# Patient Record
Sex: Female | Born: 2000 | Race: White | Hispanic: No | Marital: Single | Smoking: Never smoker
Health system: Southern US, Community
[De-identification: ages and names within clinical notes are randomized; demographics above are authoritative.]

## PROBLEM LIST (undated history)

## (undated) DIAGNOSIS — IMO0002 Reserved for concepts with insufficient information to code with codable children: Secondary | ICD-10-CM

## (undated) DIAGNOSIS — J45909 Unspecified asthma, uncomplicated: Secondary | ICD-10-CM

## (undated) DIAGNOSIS — Z8614 Personal history of Methicillin resistant Staphylococcus aureus infection: Secondary | ICD-10-CM

## (undated) HISTORY — PX: OTHER SURGICAL HISTORY: SHX169

---
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
## 2018-11-26 ENCOUNTER — Other Ambulatory Visit: Payer: Self-pay

## 2018-11-26 DIAGNOSIS — Z20822 Contact with and (suspected) exposure to covid-19: Secondary | ICD-10-CM

## 2018-11-28 LAB — NOVEL CORONAVIRUS, NAA: SARS-CoV-2, NAA: NOT DETECTED

## 2018-12-08 ENCOUNTER — Other Ambulatory Visit: Payer: Self-pay

## 2018-12-08 DIAGNOSIS — Z20822 Contact with and (suspected) exposure to covid-19: Secondary | ICD-10-CM

## 2018-12-12 LAB — NOVEL CORONAVIRUS, NAA: SARS-CoV-2, NAA: NOT DETECTED

## 2019-11-24 DIAGNOSIS — Z3042 Encounter for surveillance of injectable contraceptive: Secondary | ICD-10-CM | POA: Diagnosis not present

## 2020-02-20 DIAGNOSIS — Z3042 Encounter for surveillance of injectable contraceptive: Secondary | ICD-10-CM | POA: Diagnosis not present

## 2020-05-15 DIAGNOSIS — Z3042 Encounter for surveillance of injectable contraceptive: Secondary | ICD-10-CM | POA: Diagnosis not present

## 2020-08-22 DIAGNOSIS — Z3049 Encounter for surveillance of other contraceptives: Secondary | ICD-10-CM | POA: Diagnosis not present

## 2020-08-22 DIAGNOSIS — Z3042 Encounter for surveillance of injectable contraceptive: Secondary | ICD-10-CM | POA: Diagnosis not present

## 2020-11-08 DIAGNOSIS — Z3042 Encounter for surveillance of injectable contraceptive: Secondary | ICD-10-CM | POA: Diagnosis not present

## 2021-01-01 DIAGNOSIS — M25362 Other instability, left knee: Secondary | ICD-10-CM | POA: Diagnosis not present

## 2021-01-24 DIAGNOSIS — Z3042 Encounter for surveillance of injectable contraceptive: Secondary | ICD-10-CM | POA: Diagnosis not present

## 2021-04-15 ENCOUNTER — Encounter: Payer: Self-pay | Admitting: Emergency Medicine

## 2021-04-15 ENCOUNTER — Other Ambulatory Visit: Payer: Self-pay

## 2021-04-15 ENCOUNTER — Ambulatory Visit (INDEPENDENT_AMBULATORY_CARE_PROVIDER_SITE_OTHER): Payer: BC Managed Care – PPO

## 2021-04-15 ENCOUNTER — Ambulatory Visit
Admission: EM | Admit: 2021-04-15 | Discharge: 2021-04-15 | Disposition: A | Discharge status: Home / Self Care | Payer: BC Managed Care – PPO

## 2021-04-15 DIAGNOSIS — S93402A Sprain of unspecified ligament of left ankle, initial encounter: Secondary | ICD-10-CM | POA: Diagnosis not present

## 2021-04-15 DIAGNOSIS — M25572 Pain in left ankle and joints of left foot: Secondary | ICD-10-CM | POA: Diagnosis not present

## 2021-04-15 DIAGNOSIS — M7989 Other specified soft tissue disorders: Secondary | ICD-10-CM | POA: Diagnosis not present

## 2021-04-15 DIAGNOSIS — W19XXXA Unspecified fall, initial encounter: Secondary | ICD-10-CM | POA: Diagnosis not present

## 2021-04-15 NOTE — ED Provider Notes (Signed)
?[facility] ? ? ? ?CSN: [id number] ?Arrival date & time: 04/15/21  1707 ? ? ?  ? ?History   ?Chief Complaint ?Chief Complaint  ?Patient presents with  ? Ankle Pain  ? ? ?HPI ?DARCEL STOKLEY is a 21 y.o. female.  ? ?21 year old female, Addisin Hegeman, presents to emergency room states she was in [place] this weekend and missed a step coming off the rental property Sunday where she was staying at and rolled ankle. ? ?The history is provided by the patient. No language interpreter was used.  ?Ankle Pain ?Location:  Ankle ?Time since incident:  2 days ?Injury: yes   ?Mechanism of injury comment:  Misstep and twisted ?Ankle location:  L ankle ?Pain details:  ?  Quality:  Throbbing ?  Radiates to:  Does not radiate ?  Severity:  Moderate ?  Onset quality:  Sudden ?  Duration:  2 days ?  Timing:  Constant ?  Progression:  Unchanged ?Chronicity:  New ?Dislocation: no   ?Relieved by:  Nothing ?Exacerbated by: Works as a Art therapist, dependent. ?Ineffective treatments:  None tried ?Associated symptoms: numbness   ?Associated symptoms comment:  Swelling ? ?Past Medical History:  ?Diagnosis Date  ? Asthma   ? prn inhaler  ? History of MRSA infection 2010  ? leg  ? Malunion of fracture 05/2013  ? right middle finger  ? ? ?There are no problems to display for this patient. ? ? ?Past Surgical History:  ?Procedure Laterality Date  ? KNEE ARTHROSCOPY Left 06/2011  ? OPEN REDUCTION INTERNAL FIXATION (ORIF) METACARPAL Right 05/12/2013  ? Procedure: OPEN REDUCTION INTERNAL FIXATION (ORIF) RIGHT MIDDLE FINGER NASCENT MALUNION;  Surgeon: Roseanne Kaufman, MD;  Location: [facility];  Service: Orthopedics;  Laterality: Right;  ? ? ?OB History   ?No obstetric history on file. ?  ? ? ? ?Home Medications   ? ?Prior to Admission medications   ?Medication Sig Start Date End Date Taking? Authorizing Provider  ?acetaminophen-codeine (TYLENOL #3) 300-30 MG per tablet Take by mouth every 4 (four) hours as needed for  moderate pain.    [provider]  ?albuterol (PROVENTIL HFA;VENTOLIN HFA) 108 (90 BASE) MCG/ACT inhaler Inhale into the lungs every 6 (six) hours as needed for wheezing or shortness of breath.    [provider]  ?cetirizine (ZYRTEC) 10 MG tablet Take 10 mg by mouth daily.    [provider]  ?EPINEPHrine 0.3 mg/0.3 mL IJ SOAJ injection Inject into the muscle once.    [provider]  ?fluticasone (FLONASE) 50 MCG/ACT nasal spray Place into both nostrils 2 (two) times daily.    [provider]  ?HYDROcodone-acetaminophen (NORCO) 5-325 MG per tablet Take 2 tablets by mouth every 6 (six) hours as needed for moderate pain. 05/12/13   Roseanne Kaufman, MD  ?ibuprofen (ADVIL,MOTRIN) 600 MG tablet Take 600 mg by mouth every 6 (six) hours as needed.    [provider]  ?medroxyPROGESTERone (DEPO-PROVERA) 150 MG/ML injection Inject 150 mg into the muscle every 3 (three) months. 01/23/21   [provider]  ?Olopatadine HCl 0.2 % SOLN Apply to eye daily.    [provider]  ? ? ?Family History ?Family History  ?Problem Relation Age of Onset  ? Diabetes Maternal Grandmother   ? ? ?Social History ?Social History  ? ?Tobacco Use  ? Smoking status: Never  ? Smokeless tobacco: Never  ?Vaping Use  ? Vaping Use: Never used  ?Substance Use Topics  ?  Alcohol use: No  ? Drug use: No  ? ? ? ?Allergies   ?Penicillins ? ? ?Review of Systems ?Review of Systems  ?Musculoskeletal:  Positive for arthralgias and joint swelling.  ?All other systems reviewed and are negative. ? ? ?Physical Exam ?Triage Vital Signs ?ED Triage Vitals [04/15/21 1730]  ?Enc Vitals Group  ?   BP (!) 162/92  ?   Pulse Rate 66  ?   Resp 16  ?   Temp 97.7 ?F (36.5 ?C)  ?   Temp Source Oral  ?   SpO2 100 %  ?   Weight 182 lb 15.7 oz (83 kg)  ?   Height 5\' 6"  (1.676 m)  ?   Head Circumference   ?   Peak Flow   ?   Pain Score 6  ?   Pain Loc   ?   Pain Edu?   ?   Excl. in [place]?   ? ?No data  found. ? ?Updated Vital Signs ?BP (!) 162/92 (BP Location: Right Arm)   Pulse 66   Temp 97.7 ?F (36.5 ?C) (Oral)   Resp 16   Ht 5\' 6"  (1.676 m)   Wt 182 lb 15.7 oz (83 kg)   SpO2 100%   BMI 29.53 kg/m?  ? ?Visual Acuity ?Right Eye Distance:   ?Left Eye Distance:   ?Bilateral Distance:   ? ?Right Eye Near:   ?Left Eye Near:    ?Bilateral Near:    ? ?Physical Exam ?Vitals and nursing note reviewed.  ?Constitutional:   ?   General: She is not in acute distress. ?   Appearance: She is well-developed and well-groomed.  ?HENT:  ?   Head: Normocephalic and atraumatic.  ?Eyes:  ?   Conjunctiva/sclera: Conjunctivae normal.  ?Cardiovascular:  ?   Rate and Rhythm: Normal rate and regular rhythm.  ?   Heart sounds: No murmur heard. ?Pulmonary:  ?   Effort: Pulmonary effort is normal. No respiratory distress.  ?   Breath sounds: Normal breath sounds.  ?Abdominal:  ?   Palpations: Abdomen is soft.  ?   Tenderness: There is no abdominal tenderness.  ?Musculoskeletal:     ?   General: No swelling.  ?   Cervical back: Neck supple.  ?   Left ankle: Swelling present. Tenderness present over the lateral malleolus. Decreased range of motion. Normal pulse.  ?     Legs: ? ?Skin: ?   General: Skin is warm and dry.  ?   Capillary Refill: Capillary refill takes less than 2 seconds.  ?Neurological:  ?   General: No focal deficit present.  ?   Mental Status: She is alert and oriented to person, place, and time.  ?   GCS: GCS eye subscore is 4. GCS verbal subscore is 5. GCS motor subscore is 6.  ?   Sensory: Sensation is intact.  ?   Motor: Motor function is intact.  ?   Coordination: Coordination is intact.  ?   Gait: Gait is intact.  ?Psychiatric:     ?   Mood and Affect: Mood normal.     ?   Behavior: Behavior is cooperative.  ? ? ? ?UC Treatments / Results  ?Labs ?(all labs ordered are listed, but only abnormal results are displayed) ?Labs Reviewed - No data to display ? ?EKG ? ? ?Radiology ?DG Ankle Complete Left ? ?Result Date:  04/15/2021 ?CLINICAL DATA:  Fall with pain and swelling EXAM: LEFT ANKLE  COMPLETE - 3+ VIEW COMPARISON:  None. FINDINGS: No fracture or malalignment. Mild soft tissue swelling. Ankle mortise is symmetric IMPRESSION: Soft tissue swelling without acute osseous abnormality Electronically Signed   By: Donavan Foil M.D.   On: 04/15/2021 17:53   ? ?Procedures ?Procedures (including critical care time) ? ?Medications Ordered in UC ?Medications - No data to display ? ?Initial Impression / Assessment and Plan / UC Course  ?I have reviewed the triage vital signs and the nursing notes. ? ?Pertinent labs & imaging results that were available during my care of the patient were reviewed by me and considered in my medical decision making (see chart for details). ? ?Clinical Course as of 04/15/21 2016  ?Tue Apr 15, 2021  ?1758 DG Ankle Complete Left [JD]  ?1758 Ankle xray is negative [JD]  ?1822 Ace wrap ordered [JD]  ?  ?Clinical Course User Index ?[JD] Idamay Hosein, Jeanett Schlein, NP  ? ? ?Ddx: Left ankle sprain, fracture, dislocation ?Ace wrap applied by staff neurovascular intact pre and post application.  Verbalized understanding of plan of care to this provider. ?Final Clinical Impressions(s) / UC Diagnoses  ? ?Final diagnoses:  ?Sprain of left ankle, unspecified ligament, initial encounter  ? ? ? ?Discharge Instructions   ? ?  ?Rest, ice, elevate ,Ace wrap.  Your x-ray was negative for fracture.  May take over-the-counter Tylenol ibuprofen for discomfort/swelling.  ? ? ? ? ?ED Prescriptions   ?None ?  ? ?PDMP not reviewed this encounter. ?  ?Tori Milks, NP ?[id number] 2016 ? ?

## 2021-04-15 NOTE — ED Triage Notes (Signed)
Pt c/o left ankle pain. Started about 3 days ago. She states she was stepping off a step and landed on her ankle. She states she has swelling and has felt it burn and go numb some today.  ?

## 2021-04-15 NOTE — Discharge Instructions (Addendum)
Rest, ice, elevate ,Ace wrap.  Your x-ray was negative for fracture.  May take over-the-counter Tylenol ibuprofen for discomfort/swelling.  ?

## 2021-05-27 ENCOUNTER — Encounter: Payer: Self-pay | Admitting: Radiology

## 2021-06-06 ENCOUNTER — Encounter: Payer: Self-pay | Admitting: Radiology

## 2021-06-06 ENCOUNTER — Ambulatory Visit: Payer: BC Managed Care – PPO | Admitting: Radiology

## 2021-06-06 VITALS — BP 118/80 | Ht 66.0 in | Wt 201.0 lb

## 2021-06-06 DIAGNOSIS — Z30017 Encounter for initial prescription of implantable subdermal contraceptive: Secondary | ICD-10-CM | POA: Diagnosis not present

## 2021-06-06 NOTE — Progress Notes (Signed)
   Megan Conway 10-23-2000 [id number]   History: 21 y.o. G0 presents for contraceptive counseling,  (last Depo Provera injection 01/23/21) AEX with pediatrician 01/23/21 (has not had pap---desires to reschedule AEX)  Gynecologic History No LMP recorded. Patient has had an injection. Contraception: condoms   Obstetric History OB History  Gravida Para Term Preterm AB Living  0 0 0 0 0 0  SAB IAB Ectopic Multiple Live Births  0 0 0 0 0     The following portions of the patient's history were reviewed and updated as appropriate: allergies, current medications, past family history, past medical history, past social history, past surgical history, and problem list.  Review of Systems Pertinent items noted in HPI and remainder of comprehensive ROS otherwise negative.     Past medical history, past surgical history, family history and social history were all reviewed and documented in the EPIC chart.  ROS:  A ROS was performed and pertinent positives and negatives are included.  Exam:  Vitals:   06/06/21 1501  BP: 118/80  Weight: 201 lb (91.2 kg)  Height: 5\' 6"  (1.676 m)   Body mass index is 32.44 kg/m.   Assessment/Plan: 1. Encounter for initial prescription of etonogestrel contraceptive single-rod subdermal contraceptive implant  - Remove and insert drug implant; Future   Education given regarding options for contraception, including barrier methods, injectable contraception, IUD placement, oral contraceptives, Nexplanon .  RTO: 2 weeks for Nexplanon insertion     , WHNP-BC

## 2021-06-09 ENCOUNTER — Other Ambulatory Visit: Payer: Self-pay | Admitting: *Deleted

## 2021-06-09 DIAGNOSIS — Z30017 Encounter for initial prescription of implantable subdermal contraceptive: Secondary | ICD-10-CM

## 2021-06-20 ENCOUNTER — Ambulatory Visit (INDEPENDENT_AMBULATORY_CARE_PROVIDER_SITE_OTHER): Payer: BC Managed Care – PPO | Admitting: Radiology

## 2021-06-20 ENCOUNTER — Ambulatory Visit: Payer: BC Managed Care – PPO | Admitting: Radiology

## 2021-06-20 VITALS — BP 162/88

## 2021-06-20 DIAGNOSIS — Z30017 Encounter for initial prescription of implantable subdermal contraceptive: Secondary | ICD-10-CM | POA: Diagnosis not present

## 2021-06-20 DIAGNOSIS — Z01812 Encounter for preprocedural laboratory examination: Secondary | ICD-10-CM | POA: Diagnosis not present

## 2021-06-20 LAB — PREGNANCY, URINE: Preg Test, Ur: NEGATIVE

## 2021-06-20 NOTE — Progress Notes (Signed)
     21 y.o. G0P0000 female presents for Nexplanon insertion.  She has been counseled about alternative types of contraception and has decided this long acting method is the best for her.  Procedure, risks and benefits have all been explained.  She has no questions.  Pregnancy test: neg  After all questions were answered, consent was obtained.    Past Medical History:  Diagnosis Date   Asthma    prn inhaler   History of MRSA infection 2010   leg   Malunion of fracture 05/2013   right middle finger    Past Surgical History:  Procedure Laterality Date   finger procedure Right    2017   KNEE ARTHROSCOPY Left 06/06/2011   OPEN REDUCTION INTERNAL FIXATION (ORIF) METACARPAL Right 05/12/2013   Procedure: OPEN REDUCTION INTERNAL FIXATION (ORIF) RIGHT MIDDLE FINGER NASCENT MALUNION;  Surgeon: Dominica Severin, MD;  Location: [facility];  Service: Orthopedics;  Laterality: Right;    No current outpatient medications on file prior to visit.   No current facility-administered medications on file prior to visit.   Allergies  Allergen Reactions   Penicillins     Vitals:   06/20/21 1444  BP: (!) 162/88   Physical Exam Constitutional:      Appearance: Normal appearance.  Abdominal:     General: Abdomen is flat.  Neurological:     General: No focal deficit present.     Mental Status: She is alert and oriented to person, place, and time. Mental status is at baseline.  Psychiatric:        Mood and Affect: Mood normal.        Thought Content: Thought content normal.     Procedure: Patient placed supine on exam table with her left arm flexed at the elbow. The location for insertion site was identified 8-10 cm from epicondyle and 3-5 cm posterior to the sulcus.  In sterile fashion the area was cleansed with Betadine x 3. Insertion site and path of insertion was anesthetized with 1% Lidocaine without epinephrine, 2 cc total.  Using Nexplanon device (and after confirming  presence of rod in device), skin was pierced and then elevated along insertion path, passing device just under the skin.  Rod released and device inserted.  Rod palpated easily by myself and the patient.  Band aid and pressure bandage were applied over the site.   Chaperone, Raynelle Fanning, CMA was present for the entirety of the procedure  Assessment: Nexplanon insertion.  Plan:  Post procedure instructions reviewed with pt.  Questions answered.  Pt knows to call with any concerns or questions.  Pt is aware removal is due by 3 calendar years from today.

## 2021-07-18 ENCOUNTER — Ambulatory Visit: Payer: BC Managed Care – PPO | Admitting: Radiology

## 2021-08-22 ENCOUNTER — Ambulatory Visit: Payer: BC Managed Care – PPO | Admitting: Radiology

## 2021-09-12 ENCOUNTER — Ambulatory Visit: Payer: BC Managed Care – PPO | Admitting: Radiology

## 2021-12-02 DIAGNOSIS — L710 Perioral dermatitis: Secondary | ICD-10-CM | POA: Diagnosis not present

## 2021-12-02 DIAGNOSIS — D229 Melanocytic nevi, unspecified: Secondary | ICD-10-CM | POA: Diagnosis not present

## 2021-12-02 DIAGNOSIS — L918 Other hypertrophic disorders of the skin: Secondary | ICD-10-CM | POA: Diagnosis not present

## 2022-02-27 ENCOUNTER — Ambulatory Visit: Payer: BC Managed Care – PPO | Admitting: Radiology

## 2022-03-13 DIAGNOSIS — Z3202 Encounter for pregnancy test, result negative: Secondary | ICD-10-CM | POA: Diagnosis not present

## 2022-03-13 DIAGNOSIS — Z113 Encounter for screening for infections with a predominantly sexual mode of transmission: Secondary | ICD-10-CM | POA: Diagnosis not present

## 2022-03-13 DIAGNOSIS — Z3046 Encounter for surveillance of implantable subdermal contraceptive: Secondary | ICD-10-CM | POA: Diagnosis not present

## 2022-03-17 ENCOUNTER — Ambulatory Visit: Payer: BC Managed Care – PPO | Admitting: Radiology

## 2023-04-09 ENCOUNTER — Ambulatory Visit: Payer: PRIVATE HEALTH INSURANCE | Admitting: Family Medicine

## 2023-04-09 ENCOUNTER — Encounter: Payer: Self-pay | Admitting: Family Medicine

## 2023-04-09 VITALS — BP 123/83 | HR 101 | Temp 98.1°F | Resp 20 | Ht 66.0 in | Wt 193.0 lb

## 2023-04-09 DIAGNOSIS — Z3490 Encounter for supervision of normal pregnancy, unspecified, unspecified trimester: Secondary | ICD-10-CM | POA: Diagnosis not present

## 2023-04-09 DIAGNOSIS — Z124 Encounter for screening for malignant neoplasm of cervix: Secondary | ICD-10-CM | POA: Diagnosis not present

## 2023-04-09 DIAGNOSIS — Z23 Encounter for immunization: Secondary | ICD-10-CM

## 2023-04-09 DIAGNOSIS — Z3041 Encounter for surveillance of contraceptive pills: Secondary | ICD-10-CM

## 2023-04-09 DIAGNOSIS — Z1159 Encounter for screening for other viral diseases: Secondary | ICD-10-CM | POA: Diagnosis not present

## 2023-04-09 LAB — POCT URINE PREGNANCY: Preg Test, Ur: POSITIVE — AB

## 2023-04-09 MED ORDER — ALTAVERA 0.15-30 MG-MCG PO TABS: 1.0000 | ORAL_TABLET | Freq: Every day | ORAL | 5 refills | Status: AC

## 2023-04-09 NOTE — Assessment & Plan Note (Addendum)
 Does not know LMP.  Had spotting in February.  Was on OC until 03/21/23.  Has not had a cycle since coming off contraception.   Do not drink ETOH.  Get a PNV from the pharmacy and take one daily.  Stop caffeine.   Stat referral to OB.

## 2023-04-09 NOTE — Assessment & Plan Note (Signed)
 Request screening for hepatitis C.

## 2023-04-09 NOTE — Progress Notes (Signed)
 New Patient Office Visit  Subjective    Patient ID: Megan Conway, female    DOB: [birth year]  Age: 23 y.o. MRN: [id number]  CC:  Chief Complaint  Patient presents with   Medical Management of Chronic Issues   Immunizations   Contraception    HPI Megan Conway presents to establish care. Delightful 23 yo on OCs, asthma as a child.  Works as a Sales executive.   She si doing well and has not complaints.  She ran out of her Ocs 03/21/23.  She has not had a cycle since running out of contraception.   She is a red-head and has freckles.  She denies having any mole that she is concerned about.  No family history of melanoma.  She has seen a Dermatologist in the past.   She has gotten her HPV vaccines.  She is UTD with tetanus.  She would like to be screened for Hepatitis C.      Outpatient Encounter Medications as of 04/09/2023  Medication Sig   [DISCONTINUED] ALTAVERA 0.15-30 MG-MCG tablet Take by mouth daily.   ALTAVERA 0.15-30 MG-MCG tablet Take 1 tablet by mouth daily.   No facility-administered encounter medications on file as of 04/09/2023.    Past Medical History:  Diagnosis Date   Asthma    prn inhaler   History of MRSA infection 2010   leg   Malunion of fracture 05/2013   right middle finger    Past Surgical History:  Procedure Laterality Date   finger procedure Right    2017   KNEE ARTHROSCOPY Left 06/06/2011   OPEN REDUCTION INTERNAL FIXATION (ORIF) METACARPAL Right 05/12/2013   Procedure: OPEN REDUCTION INTERNAL FIXATION (ORIF) RIGHT MIDDLE FINGER NASCENT MALUNION;  Surgeon: Dominica Severin, MD;  Location: [facility];  Service: Orthopedics;  Laterality: Right;    Family History  Problem Relation Age of Onset   Heart disease Maternal Uncle    Diabetes Maternal Grandmother    Heart disease Maternal Grandfather    Breast cancer Other        MGGM    Social History   Socioeconomic History   Marital status: Single    Spouse name: Not on file    Number of children: Not on file   Years of education: Not on file   Highest education level: Not on file  Occupational History   Not on file  Tobacco Use   Smoking status: Never    Passive exposure: Never   Smokeless tobacco: Never  Vaping Use   Vaping status: Never Used  Substance and Sexual Activity   Alcohol use: Yes    Comment: rare   Drug use: No   Sexual activity: Yes    Partners: Male    Birth control/protection: Condom  Other Topics Concern   Not on file  Social History Narrative   Maternal grandmother is legal guardian.  To bring documentation of guardianship DOS.   Social Drivers of Corporate investment banker Strain: Not on file  Food Insecurity: Not on file  Transportation Needs: Not on file  Physical Activity: Not on file  Stress: Not on file  Social Connections: Not on file  Intimate Partner Violence: Not on file    ROS      Objective   BP 123/83 (BP Location: Left Arm, Patient Position: Sitting, Cuff Size: Normal)   Pulse (!) 101   Temp 98.1 F (36.7 C) (Oral)   Resp 20   Ht 5'  6" (1.676 m)   Wt 193 lb (87.5 kg)   LMP  (LMP Unknown)   BMI 31.15 kg/m    Physical Exam Vitals and nursing note reviewed.  Constitutional:      Appearance: Normal appearance.  HENT:     Head: Normocephalic and atraumatic.  Eyes:     Conjunctiva/sclera: Conjunctivae normal.  Cardiovascular:     Rate and Rhythm: Normal rate and regular rhythm.  Pulmonary:     Effort: Pulmonary effort is normal.     Breath sounds: Normal breath sounds.  Musculoskeletal:     Right lower leg: No edema.     Left lower leg: No edema.  Skin:    General: Skin is warm and dry.  Neurological:     Mental Status: She is alert and oriented to person, place, and time.  Psychiatric:        Mood and Affect: Mood normal.        Behavior: Behavior normal.        Thought Content: Thought content normal.        Judgment: Judgment normal.            The ASCVD Risk score (Arnett  DK, et al., 2019) failed to calculate for the following reasons:   The 2019 ASCVD risk score is only valid for ages 44 to 38     Assessment & Plan:  Need for vaccination  Encounter for surveillance of contraceptive pills Assessment & Plan: Refilled altavera for 6 months.  Hasn't Had OC since 3/16 and has not had  menses yet.  Will check UPT.  She is pregnant.  Do not take any more birth control pills.    Orders: -     Altavera; Take 1 tablet by mouth daily.  Dispense: 28 tablet; Refill: 5 -     POCT urine pregnancy  Encounter for hepatitis C screening test for low risk patient Assessment & Plan: Request screening for hepatitis C.  Orders: -     Hepatitis C antibody  Screening for cervical cancer Assessment & Plan: Overdue for her first pap test.  Please get scheduled for a pap.     Pregnancy, unspecified gestational age Assessment & Plan: Does not know LMP.  Had spotting in February.  Was on OC until 03/21/23.  Has not had a cycle since coming off contraception.   Do not drink ETOH.  Get a PNV from the pharmacy and take one daily.  Stop caffeine.   Stat referral to OB.  Orders: -     Ambulatory referral to Gynecology    Return Follow up with GYN, for physical exam.   Alease Medina, MD

## 2023-04-09 NOTE — Assessment & Plan Note (Addendum)
 Refilled altavera for 6 months.  Hasn't Had OC since 3/16 and has not had  menses yet.  Will check UPT.  She is pregnant.  Do not take any more birth control pills.

## 2023-04-09 NOTE — Assessment & Plan Note (Signed)
 Overdue for her first pap test.  Please get scheduled for a pap.

## 2023-04-10 ENCOUNTER — Ambulatory Visit
Admission: RE | Admit: 2023-04-10 | Discharge: 2023-04-10 | Disposition: A | Discharge status: Home / Self Care | Source: Ambulatory Visit | Attending: Family Medicine | Admitting: Family Medicine

## 2023-04-10 VITALS — BP 127/84 | HR 66 | Temp 98.0°F | Resp 16

## 2023-04-10 DIAGNOSIS — Z3491 Encounter for supervision of normal pregnancy, unspecified, first trimester: Secondary | ICD-10-CM | POA: Diagnosis not present

## 2023-04-10 LAB — HEPATITIS C ANTIBODY: Hep C Virus Ab: NONREACTIVE

## 2023-04-10 NOTE — Discharge Instructions (Addendum)
 Stop by the pharmacy to pick up some prenatal vitamins preferably with iron in them.  Take with food.  See pregnancy safe medication list provided.  Schedule your prenatal ultrasound. Enjoy your pregnancy!

## 2023-04-10 NOTE — ED Provider Notes (Signed)
 [facility]    CSN: [id number] Arrival date & time: 04/10/23  1110      History   Chief Complaint Chief Complaint  Patient presents with   Possible Pregnancy    I found out at my primary doctor today that I am pregnant. I would like to get an ultrasound to confirm how far along I am since I will not be able to get into an OBGYN until later next week - Entered by patient     HPI HPI Megan Conway is a 23 y.o. female.    Megan Conway presents for pregnacy.  She wants to know who far along she is.  Last normal was around [facility]. She took her birth control daily at 1230 PM with her lunch.  She is surprised about being told she was pregnant. She notes that she had been drinking and stuff but none since she found out she was pregnant yesterday. She has not picked up any prenatal vitamins. She did not pick up her birth control that was prescribed. Stopped taking these.   She went home and took 2 more pregnancy tests that were positive.           Past Medical History:  Diagnosis Date   Asthma    prn inhaler   History of MRSA infection 2010   leg   Malunion of fracture 05/2013   right middle finger    Patient Active Problem List   Diagnosis Date Noted   Encounter for hepatitis C screening test for low risk patient 04/09/2023   Encounter for surveillance of contraceptive pills 04/09/2023   Screening for cervical cancer 04/09/2023   Pregnancy 04/09/2023    Past Surgical History:  Procedure Laterality Date   finger procedure Right    2017   KNEE ARTHROSCOPY Left 06/06/2011   OPEN REDUCTION INTERNAL FIXATION (ORIF) METACARPAL Right 05/12/2013   Procedure: OPEN REDUCTION INTERNAL FIXATION (ORIF) RIGHT MIDDLE FINGER NASCENT MALUNION;  Surgeon: Dominica Severin, MD;  Location: [facility];  Service: Orthopedics;  Laterality: Right;    OB History     Gravida  0   Para  0   Term  0   Preterm  0   AB  0   Living  0      SAB   0   IAB  0   Ectopic  0   Multiple  0   Live Births  0            Home Medications    Prior to Admission medications   Medication Sig Start Date End Date Taking? Authorizing Provider  ALTAVERA 0.15-30 MG-MCG tablet Take 1 tablet by mouth daily. 04/09/23   Ziglar, Eli Phillips, MD    Family History Family History  Problem Relation Age of Onset   Heart disease Maternal Uncle    Diabetes Maternal Grandmother    Heart disease Maternal Grandfather    Breast cancer Other        MGGM    Social History Social History   Tobacco Use   Smoking status: Never    Passive exposure: Never   Smokeless tobacco: Never  Vaping Use   Vaping status: Never Used  Substance Use Topics   Alcohol use: Yes    Comment: rare   Drug use: No     Allergies   Penicillins   Review of Systems Review of Systems: :negative unless otherwise stated in HPI.      Physical  Exam Triage Vital Signs ED Triage Vitals [04/10/23 1116]  Encounter Vitals Group     BP 127/84     Systolic BP Percentile      Diastolic BP Percentile      Pulse Rate 66     Resp 16     Temp 98 F (36.7 C)     Temp Source Oral     SpO2 97 %     Weight      Height      Head Circumference      Peak Flow      Pain Score 0     Pain Loc      Pain Education      Exclude from Growth Chart    No data found.  Updated Vital Signs BP 127/84 (BP Location: Right Arm)   Pulse 66   Temp 98 F (36.7 C) (Oral)   Resp 16   LMP  (LMP Unknown)   SpO2 97%   Visual Acuity Right Eye Distance:   Left Eye Distance:   Bilateral Distance:    Right Eye Near:   Left Eye Near:    Bilateral Near:     Physical Exam GEN: well appearing female in no acute distress  CVS: well perfused  RESP: speaking in full sentences without pause     UC Treatments / Results  Labs (all labs ordered are listed, but only abnormal results are displayed) Labs Reviewed - No data to display  EKG   Radiology No results  found.  Procedures Procedures (including critical care time)  Medications Ordered in UC Medications - No data to display  Initial Impression / Assessment and Plan / UC Course  I have reviewed the triage vital signs and the nursing notes.  Pertinent labs & imaging results that were available during my care of the patient were reviewed by me and considered in my medical decision making (see chart for details).      Patient is a 23 y.o. female  who presents for pregnancy.  Last known LMP 02/20/23 with suspected gestational age of [redacted]w[redacted]d. However she is unsure this is the right date.  Requests ultrasound to see how far along she is. Explained to patient that we do not perform OB ultrasounds here.  She tried to schedule her OB ultrasound but was not able to.  She is surprised about her pregnancy diagnosis as she reports taking her birth control daily around 1230 PM. Pt wanting to keep the baby.   Advised to pick up some prenatal vitamins with iron. Provided with pregnancy safe medication handout. Follow up with OB. Stat referral placed by PCP yesterday.      Final Clinical Impressions(s) / UC Diagnoses   Final diagnoses:  Pregnant and not yet delivered in first trimester     Discharge Instructions      Stop by the pharmacy to pick up some prenatal vitamins preferably with iron in them.  Take with food.  See pregnancy safe medication list provided.  Schedule your prenatal ultrasound. Enjoy your pregnancy!      ED Prescriptions   None    PDMP not reviewed this encounter.   Katha Cabal, DO 04/10/23 1146

## 2023-04-10 NOTE — ED Triage Notes (Signed)
 I did make pt aware we can't do [place]. Unsure of LMP.

## 2023-04-12 ENCOUNTER — Encounter: Payer: Self-pay | Admitting: Family Medicine

## 2023-05-14 ENCOUNTER — Ambulatory Visit: Admitting: Family Medicine
